# Patient Record
Sex: Female | Born: 1990 | Race: White | Hispanic: No | Marital: Married | Smoking: Current every day smoker
Health system: Southern US, Community
[De-identification: ages and names within clinical notes are randomized; demographics above are authoritative.]

## PROBLEM LIST (undated history)

## (undated) HISTORY — PX: TONSILLECTOMY: SUR1361

---
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
## 2012-06-30 ENCOUNTER — Encounter (HOSPITAL_BASED_OUTPATIENT_CLINIC_OR_DEPARTMENT_OTHER): Payer: Self-pay | Admitting: *Deleted

## 2012-06-30 ENCOUNTER — Emergency Department (HOSPITAL_BASED_OUTPATIENT_CLINIC_OR_DEPARTMENT_OTHER)
Admission: EM | Admit: 2012-06-30 | Discharge: 2012-06-30 | Disposition: A | Discharge status: Home / Self Care | Payer: Self-pay | Attending: Emergency Medicine | Admitting: Emergency Medicine

## 2012-06-30 DIAGNOSIS — K5289 Other specified noninfective gastroenteritis and colitis: Secondary | ICD-10-CM | POA: Insufficient documentation

## 2012-06-30 DIAGNOSIS — N760 Acute vaginitis: Secondary | ICD-10-CM | POA: Insufficient documentation

## 2012-06-30 DIAGNOSIS — B9689 Other specified bacterial agents as the cause of diseases classified elsewhere: Secondary | ICD-10-CM

## 2012-06-30 DIAGNOSIS — K529 Noninfective gastroenteritis and colitis, unspecified: Secondary | ICD-10-CM

## 2012-06-30 DIAGNOSIS — R197 Diarrhea, unspecified: Secondary | ICD-10-CM | POA: Insufficient documentation

## 2012-06-30 DIAGNOSIS — Z87891 Personal history of nicotine dependence: Secondary | ICD-10-CM | POA: Insufficient documentation

## 2012-06-30 DIAGNOSIS — R112 Nausea with vomiting, unspecified: Secondary | ICD-10-CM | POA: Insufficient documentation

## 2012-06-30 LAB — URINALYSIS, ROUTINE W REFLEX MICROSCOPIC
Nitrite: NEGATIVE
Specific Gravity, Urine: 1.031 — ABNORMAL HIGH (ref 1.005–1.030)
Urobilinogen, UA: 0.2 mg/dL (ref 0.0–1.0)
pH: 5 (ref 5.0–8.0)

## 2012-06-30 LAB — CBC WITH DIFFERENTIAL/PLATELET
HCT: 37.6 % (ref 36.0–46.0)
Hemoglobin: 12.4 g/dL (ref 12.0–15.0)
Lymphs Abs: 0.7 10*3/uL (ref 0.7–4.0)
MCH: 30.2 pg (ref 26.0–34.0)
MCHC: 33 g/dL (ref 30.0–36.0)
Monocytes Absolute: 0.4 10*3/uL (ref 0.1–1.0)
Monocytes Relative: 9 % (ref 3–12)
Neutro Abs: 3.4 10*3/uL (ref 1.7–7.7)
Neutrophils Relative %: 74 % (ref 43–77)
RBC: 4.1 MIL/uL (ref 3.87–5.11)

## 2012-06-30 LAB — COMPREHENSIVE METABOLIC PANEL
Alkaline Phosphatase: 56 U/L (ref 39–117)
BUN: 14 mg/dL (ref 6–23)
Chloride: 107 mEq/L (ref 96–112)
Creatinine, Ser: 0.7 mg/dL (ref 0.50–1.10)
GFR calc Af Amer: >90 mL/min (ref >90)
GFR calc non Af Amer: >90 mL/min (ref >90)
Glucose, Bld: 81 mg/dL (ref 70–99)
Potassium: 3.8 mEq/L (ref 3.5–5.1)
Total Bilirubin: 0.5 mg/dL (ref 0.3–1.2)

## 2012-06-30 LAB — WET PREP, GENITAL: Trich, Wet Prep: NONE SEEN

## 2012-06-30 LAB — URINE MICROSCOPIC-ADD ON

## 2012-06-30 MED ORDER — OXYCODONE-ACETAMINOPHEN 5-325 MG PO TABS
1.0000 | ORAL_TABLET | Freq: Four times a day (QID) | ORAL | Status: DC | PRN
Start: 2012-06-30 — End: 2019-07-07

## 2012-06-30 MED ORDER — METRONIDAZOLE 500 MG PO TABS: 500.0000 mg | ORAL_TABLET | Freq: Two times a day (BID) | ORAL | Status: DC

## 2012-06-30 MED ORDER — PROMETHAZINE HCL 25 MG PO TABS: 25.0000 mg | ORAL_TABLET | Freq: Four times a day (QID) | ORAL | Status: DC | PRN

## 2012-06-30 MED ORDER — SODIUM CHLORIDE 0.9 % IV BOLUS (SEPSIS)
1000.0000 mL | Freq: Once | INTRAVENOUS | Status: AC
  Administered 2012-06-30: 1000 mL via INTRAVENOUS

## 2012-06-30 NOTE — ED Provider Notes (Signed)
History   This chart was scribed for [name], MD by Donne Anon, ED Scribe. This patient was seen in room MH11/MH11 and the patient's care was started at 17:41.   CSN: [id number]  Arrival date & time 06/30/12  2440   First MD Initiated Contact with Patient 06/30/12 1818      Chief Complaint  Patient presents with  . Abdominal Pain     The history is provided by the patient. No language interpreter was used.   Ruth Fletcher is a 21 y.o. female who presents to the Emergency Department complaining of gradual onset, intermittent (1 hour episodes), lower abdominal pain which began 2 days ago. She reports associated nausea, diarrhea, and 1 episode of emesis this morning. She denies hematachezia, dysuria, fever, or vaginal discharge. She has never been pregnant before and does not receive regular PAP smears. Her last menstrual period began today and this is not typical menstrual pain. She denies any previous similar episodes and does not have a h/o any illness.    History reviewed. No pertinent past medical history.  Past Surgical History  Procedure Date  . Tonsillectomy     History reviewed. No pertinent family history.  History  Substance Use Topics  . Smoking status: Former Games developer  . Smokeless tobacco: Not on file  . Alcohol Use: No    OB History    Grav Para Term Preterm Abortions TAB SAB Ect Mult Living                  Review of Systems A complete 10 system review of systems was obtained and all systems are negative except as noted in the HPI and PMH.   Allergies  Review of patient's allergies indicates no known allergies.  Home Medications   Current Outpatient Rx  Name  Route  Sig  Dispense  Refill  . AMOXICILLIN PO   Oral   Take by mouth.           . IBUPROFEN 100 MG/5ML PO SUSP   Oral   Take 200 mg by mouth once.           . OXYCODONE HCL PO   Oral   Take 5-10 mLs by mouth every 4 (four) hours as needed. For pain            Triage  Vitals: BP 120/68  Pulse 99  Temp 99 F (37.2 C) (Oral)  Resp 16  Ht 5\' 10"  (1.778 m)  Wt 155 lb (70.308 kg)  BMI 22.24 kg/m2  SpO2 100%  LMP 06/30/2012  Physical Exam  Nursing note and vitals reviewed. Constitutional: She is oriented to person, place, and time. She appears well-developed and well-nourished.  HENT:  Head: Normocephalic and atraumatic.  Eyes: EOM are normal. Pupils are equal, round, and reactive to light.  Neck: Normal range of motion. Neck supple.  Cardiovascular: Normal rate, normal heart sounds and intact distal pulses.   Pulmonary/Chest: Effort normal and breath sounds normal.  Abdominal: Bowel sounds are normal. She exhibits no distension. There is tenderness. There is no guarding.       Tender across lower abdomen bilaterally.  Musculoskeletal: Normal range of motion. She exhibits no edema and no tenderness.  Neurological: She is alert and oriented to person, place, and time. She has normal strength. No cranial nerve deficit or sensory deficit.  Skin: Skin is warm and dry. No rash noted.  Psychiatric: She has a normal mood and affect.  ED Course  Procedures (including critical care time) DIAGNOSTIC STUDIES: Oxygen Saturation is 100% on room air, normal by my interpretation.    COORDINATION OF CARE: 6:26 PMDiscussed treatment plan which includes a pelvic exam with pt at bedside and pt agreed to plan.    Labs Reviewed  URINALYSIS, ROUTINE W REFLEX MICROSCOPIC - Abnormal; Notable for the following:    APPearance CLOUDY (*)     Specific Gravity, Urine 1.031 (*)     Hgb urine dipstick MODERATE (*)     Bilirubin Urine SMALL (*)     Ketones, ur 15 (*)     Leukocytes, UA SMALL (*)     All other components within normal limits  URINE MICROSCOPIC-ADD ON - Abnormal; Notable for the following:    Crystals CA OXALATE CRYSTALS (*)     All other components within normal limits  PREGNANCY, URINE   No results found.   No diagnosis found.    MDM  Pt  asked for female to do pelvic exam, done by Langston Masker, PA who reports no tenderness, discharge, etc.  PT still having intermittent cramping pain, no focal RLQ tenderness, normal WBC, suspect viral illness although early appendicitis is also in the differential. Pt advised to return to the ER for worsening pain, persistent vomiting or for any other concerns. Will treat for BV based on wet prep. No symptoms concerning for yeast infection.   I personally performed the services described in this documentation, which was scribed in my presence. The recorded information has been reviewed and is accurate.         Emmerson Shuffield B. Bernette Mayers, MD 06/30/12 2020

## 2012-06-30 NOTE — ED Notes (Signed)
Pt has agreed to pelvic exam with female EDPA

## 2012-06-30 NOTE — ED Notes (Signed)
Warm blankets given.

## 2012-06-30 NOTE — ED Notes (Signed)
Patient is resting comfortably. 

## 2012-06-30 NOTE — ED Provider Notes (Signed)
Medical screening examination/treatment/procedure(s) were conducted as a shared visit with non-physician practitioner(s) and myself.  I personally evaluated the patient during the encounter   Charles B. Bernette Mayers, MD 06/30/12 2024

## 2012-06-30 NOTE — ED Provider Notes (Signed)
History     CSN: [id number]  Arrival date & time 06/30/12  1741   First MD Initiated Contact with Patient 06/30/12 1818      Chief Complaint  Patient presents with  . Abdominal Pain    (Consider location/radiation/quality/duration/timing/severity/associated sxs/prior treatment) HPI  History reviewed. No pertinent past medical history.  Past Surgical History  Procedure Date  . Tonsillectomy     History reviewed. No pertinent family history.  History  Substance Use Topics  . Smoking status: Former Games developer  . Smokeless tobacco: Not on file  . Alcohol Use: No    OB History    Grav Para Term Preterm Abortions TAB SAB Ect Mult Living                  Review of Systems  Allergies  Review of patient's allergies indicates no known allergies.  Home Medications   Current Outpatient Rx  Name  Route  Sig  Dispense  Refill  . METRONIDAZOLE 500 MG PO TABS   Oral   Take 1 tablet (500 mg total) by mouth 2 (two) times daily.   14 tablet   0   . OXYCODONE-ACETAMINOPHEN 5-325 MG PO TABS   Oral   Take 1-2 tablets by mouth every 6 (six) hours as needed for pain.   20 tablet   0   . PROMETHAZINE HCL 25 MG PO TABS   Oral   Take 1 tablet (25 mg total) by mouth every 6 (six) hours as needed for nausea.   20 tablet   0     BP 120/68  Pulse 99  Temp 99 F (37.2 C) (Oral)  Resp 16  Ht 5\' 10"  (1.778 m)  Wt 155 lb (70.308 kg)  BMI 22.24 kg/m2  SpO2 100%  LMP 06/30/2012  Physical Exam  Genitourinary: Uterus normal.       Dark red blood vaginal vault,  Adnexa nontender, cervix nontender,      ED Course  Procedures (including critical care time)  Labs Reviewed  URINALYSIS, ROUTINE W REFLEX MICROSCOPIC - Abnormal; Notable for the following:    APPearance CLOUDY (*)     Specific Gravity, Urine 1.031 (*)     Hgb urine dipstick MODERATE (*)     Bilirubin Urine SMALL (*)     Ketones, ur 15 (*)     Leukocytes, UA SMALL (*)     All other components within normal  limits  URINE MICROSCOPIC-ADD ON - Abnormal; Notable for the following:    Crystals CA OXALATE CRYSTALS (*)     All other components within normal limits  WET PREP, GENITAL - Abnormal; Notable for the following:    Yeast Wet Prep HPF POC MANY (*)     Clue Cells Wet Prep HPF POC MODERATE (*)     WBC, Wet Prep HPF POC MANY (*)     All other components within normal limits  PREGNANCY, URINE  CBC WITH DIFFERENTIAL  COMPREHENSIVE METABOLIC PANEL  GC/CHLAMYDIA PROBE AMP   No results found.   1. Gastroenteritis       MDM          Elson Areas, [place] 06/30/12 2023

## 2012-06-30 NOTE — ED Notes (Signed)
Pt c/o abd pain/n/v/d x 2 days.  

## 2019-03-10 ENCOUNTER — Emergency Department (INDEPENDENT_AMBULATORY_CARE_PROVIDER_SITE_OTHER): Payer: Self-pay

## 2019-03-10 ENCOUNTER — Emergency Department
Admission: EM | Admit: 2019-03-10 | Discharge: 2019-03-10 | Disposition: A | Discharge status: Home / Self Care | Payer: Self-pay | Source: Home / Self Care | Attending: Family Medicine | Admitting: Family Medicine

## 2019-03-10 ENCOUNTER — Other Ambulatory Visit: Payer: Self-pay

## 2019-03-10 DIAGNOSIS — J0120 Acute ethmoidal sinusitis, unspecified: Secondary | ICD-10-CM

## 2019-03-10 DIAGNOSIS — G43109 Migraine with aura, not intractable, without status migrainosus: Secondary | ICD-10-CM

## 2019-03-10 DIAGNOSIS — J322 Chronic ethmoidal sinusitis: Secondary | ICD-10-CM

## 2019-03-10 MED ORDER — DEXAMETHASONE SODIUM PHOSPHATE 10 MG/ML IJ SOLN
10.0000 mg | Freq: Once | INTRAMUSCULAR | Status: AC
  Administered 2019-03-10: 10 mg via INTRAMUSCULAR

## 2019-03-10 MED ORDER — KETOROLAC TROMETHAMINE 60 MG/2ML IM SOLN
60.0000 mg | Freq: Once | INTRAMUSCULAR | Status: AC
  Administered 2019-03-10: 60 mg via INTRAMUSCULAR

## 2019-03-10 MED ORDER — AMOXICILLIN 875 MG PO TABS: 875.0000 mg | ORAL_TABLET | Freq: Two times a day (BID) | ORAL | 0 refills | Status: DC

## 2019-03-10 NOTE — Discharge Instructions (Addendum)
May take Pseudoephedrine (30mg, one or two every 4 to 6 hours) for sinus congestion.    May use Afrin nasal spray (or generic oxymetazoline) each morning for about 5 days and then discontinue.  Also recommend using saline nasal spray several times daily and saline nasal irrigation (AYR is a common brand).  Use Flonase nasal spray each morning after using Afrin nasal spray and saline nasal irrigation.   

## 2019-03-10 NOTE — ED Provider Notes (Signed)
Ruth Fletcher    CSN: [id number] Arrival date & time: 03/10/19  5188     History   Chief Complaint Chief Complaint  Patient presents with   Eye Pain    HPI Ruth Fletcher is a 28 y.o. female.   Patient was performing her usual exercise routine at 6:30am today when she suddenly experienced "circles" in her vision and a decrease in her peripheral vision.  She thought she might be dehydrated so she increased her fluid intake without improvement.  Her vision changes gradually resolved over a period of about 3.5 hours, but during that time she gradually developed a throbbing headache behind her eyes.  This headache gradually became severe and generalized, which she describes as the worst headache she has ever had.  She reports photophobia, but no nausea/vomiting.  Prior to her present headache she had been well. She admits that she had migraine headaches while in elementary school.  These gradually resolved, but she continues to have mild monthly premenstrual headaches. She has no family history of migraine headaches.  The history is provided by the patient.  Headache Pain location:  Generalized Quality:  Stabbing Radiates to:  Does not radiate Severity currently:  10/10 Severity at highest:  10/10 Onset quality:  Gradual Duration:  1 hour Timing:  Constant Progression:  Unchanged Chronicity:  New Similar to prior headaches: no   Context: activity and bright light   Relieved by:  None tried Worsened by:  Light and activity Ineffective treatments:  None tried Associated symptoms: congestion, photophobia and visual change   Associated symptoms: no abdominal pain, no blurred vision, no cough, no diarrhea, no dizziness, no drainage, no ear pain, no eye pain, no facial pain, no fatigue, no fever, no focal weakness, no hearing loss, no loss of balance, no myalgias, no nausea, no near-syncope, no neck pain, no neck stiffness, no numbness, no paresthesias, no seizures, no sinus  pressure, no sore throat, no swollen glands, no syncope, no tingling, no URI, no vomiting and no weakness     History reviewed.  Past history of migraine headaches as a child  Active problems:  None   Past Surgical History:  Procedure Laterality Date   TONSILLECTOMY      OB History   No obstetric history      Home Medications    Prior to Admission medications   Medication Sig Start Date End Date Taking? Authorizing Provider  amoxicillin (AMOXIL) 875 MG tablet Take 1 tablet (875 mg total) by mouth 2 (two) times daily. 03/10/19   Kandra Nicolas, MD  metroNIDAZOLE (FLAGYL) 500 MG tablet Take 1 tablet (500 mg total) by mouth 2 (two) times daily. 06/30/12   Calvert Cantor, MD  oxyCODONE-acetaminophen (PERCOCET/ROXICET) 5-325 MG per tablet Take 1-2 tablets by mouth every 6 (six) hours as needed for pain. 06/30/12   Calvert Cantor, MD  promethazine (PHENERGAN) 25 MG tablet Take 1 tablet (25 mg total) by mouth every 6 (six) hours as needed for nausea. 06/30/12   Calvert Cantor, MD    Family History History reviewed. No family history of migraine headaches.  Social History Social History   Tobacco Use   Smoking status: Current Every Day Smoker    Types: E-cigarettes   Smokeless tobacco: Never Used  Substance Use Topics   Alcohol use: Yes    Comment: 2x a week   Drug use: No     Allergies   Patient has no known allergies.   Review of Systems Review  of Systems  Constitutional: Negative for activity change, appetite change, chills, diaphoresis, fatigue and fever.  HENT: Positive for congestion. Negative for ear pain, hearing loss, postnasal drip, sinus pressure and sore throat.   Eyes: Positive for photophobia. Negative for blurred vision and pain.  Respiratory: Negative for cough.   Cardiovascular: Negative for syncope and near-syncope.  Gastrointestinal: Negative for abdominal pain, diarrhea, nausea and vomiting.  Musculoskeletal: Negative for myalgias, neck  pain and neck stiffness.  Neurological: Positive for headaches. Negative for dizziness, focal weakness, seizures, weakness, numbness, paresthesias and loss of balance.  All other systems reviewed and are negative.    Physical Exam Triage Vital Signs ED Triage Vitals  Enc Vitals Group     BP 03/10/19 1042 117/76     Pulse Rate 03/10/19 1042 63     Resp 03/10/19 1042 20     Temp 03/10/19 1042 98.4 F (36.9 C)     Temp Source 03/10/19 1042 Oral     SpO2 03/10/19 1042 100 %     Weight 03/10/19 1043 164 lb (74.4 kg)     Height 03/10/19 1043 5\' 9"  (1.753 m)     Head Circumference --      Peak Flow --      Pain Score 03/10/19 1043 9     Pain Loc --      Pain Edu? --      Excl. in GC? --    No data found.  Updated Vital Signs BP 117/76 (BP Location: Right Arm)    Pulse 63    Temp 98.4 F (36.9 C) (Oral)    Resp 20    Ht 5\' 9"  (1.753 m)    Wt 74.4 kg    LMP 03/02/2019    SpO2 100%    BMI 24.22 kg/m   Visual Acuity Right Eye Distance: 20/25 Left Eye Distance: 20/25 Bilateral Distance: 20/25  Right Eye Near:   Left Eye Near:    Bilateral Near:     Physical Exam Vitals signs and nursing note reviewed.  Constitutional:      General: She is in acute distress.     Appearance: Normal appearance. She is not ill-appearing.  HENT:     Head: Normocephalic.     Comments: No sinus tenderness present.    Right Ear: Tympanic membrane normal.     Left Ear: Tympanic membrane normal.     Nose: Congestion present.     Mouth/Throat:     Pharynx: Oropharynx is clear.  Eyes:     Extraocular Movements: Extraocular movements intact.     Conjunctiva/sclera: Conjunctivae normal.     Pupils: Pupils are equal, round, and reactive to light.     Comments: Photophobia is present.  Fundi benign.  Neck:     Musculoskeletal: Neck supple. No muscular tenderness.  Cardiovascular:     Heart sounds: Normal heart sounds.  Pulmonary:     Breath sounds: Normal breath sounds.  Abdominal:      Tenderness: There is no abdominal tenderness.  Musculoskeletal:     Right lower leg: No edema.     Left lower leg: No edema.  Lymphadenopathy:     Cervical: No cervical adenopathy.  Skin:    General: Skin is warm and dry.  Neurological:     General: No focal deficit present.     Mental Status: She is alert and oriented to person, place, and time.     Cranial Nerves: No cranial nerve deficit.  Sensory: No sensory deficit.     Motor: No weakness.     Coordination: Coordination normal.     Gait: Gait normal.     Deep Tendon Reflexes: Reflexes normal.      UC Treatments / Results  Labs (all labs ordered are listed, but only abnormal results are displayed) Labs Reviewed - No data to display  EKG   Radiology Ct Head Wo Contrast  Result Date: 03/10/2019 CLINICAL DATA:  Acute onset severe headache.  Visual disturbance EXAM: CT HEAD WITHOUT CONTRAST TECHNIQUE: Contiguous axial images were obtained from the base of the skull through the vertex without intravenous contrast. COMPARISON:  None. FINDINGS: Brain: The ventricles are normal in size and configuration. There is no intracranial mass, hemorrhage, extra-axial fluid collection, or midline shift. Brain parenchyma appears unremarkable. No evident acute infarct. Vascular: No hyperdense vessel. There is no appreciable vascular calcification. Skull: Bony calvarium appears intact. Sinuses/Orbits: There is mucosal thickening and opacification in multiple ethmoid air cells. Other visualized paranasal sinuses are clear. Frontal sinuses are virtually aplastic. Visualized orbits appear symmetric bilaterally. Other: Mastoid air cells are clear. IMPRESSION: Ethmoid sinus disease.  Study otherwise unremarkable. Electronically Signed   By: Bretta BangWilliam  Woodruff III M.D.   On: 03/10/2019 11:50    Procedures Procedures (including critical Fletcher time)  Medications Ordered in UC Medications  ketorolac (TORADOL) injection 60 mg (has no administration in  time range)  dexamethasone (DECADRON) injection 10 mg (has no administration in time range)    Initial Impression / Assessment and Plan / UC Course  I have reviewed the triage vital signs and the nursing notes.  Pertinent labs & imaging results that were available during my Fletcher of the patient were reviewed by me and considered in my medical decision making (see chart for details).    Ethmoid sinusitis probably an incidental finding. Administered Toradol 60mg  IM.  Administered Decadron 10mg  IM.  Begin amoxicillin  Followup with Family Doctor if headaches begin to recur.   Final Clinical Impressions(s) / UC Diagnoses   Final diagnoses:  Migraine with aura and without status migrainosus, not intractable  Ethmoid sinusitis, unspecified chronicity     Discharge Instructions      May take Pseudoephedrine (30mg , one or two every 4 to 6 hours) for sinus congestion.   May use Afrin nasal spray (or generic oxymetazoline) each morning for about 5 days and then discontinue.  Also recommend using saline nasal spray several times daily and saline nasal irrigation (AYR is a common brand).  Use Flonase nasal spray each morning after using Afrin nasal spray and saline nasal irrigation.          ED Prescriptions    Medication Sig Dispense Auth. Provider   amoxicillin (AMOXIL) 875 MG tablet Take 1 tablet (875 mg total) by mouth 2 (two) times daily. 20 tablet Ruth Fletcher, Aadhya Bustamante A, MD       Ruth Fletcher, Yeraldi Fidler A, MD 03/11/19 [phone]

## 2019-03-10 NOTE — ED Triage Notes (Signed)
Was working out this morning and had "circles" in her vision.  Thought she may have been dehydrated, so hydrated.  Most of the visual disturbances have resolved, but having a headache behind eyes and pain in the back of the head/neck area.

## 2019-07-07 ENCOUNTER — Encounter: Payer: Self-pay | Admitting: Emergency Medicine

## 2019-07-07 ENCOUNTER — Other Ambulatory Visit: Payer: Self-pay

## 2019-07-07 ENCOUNTER — Emergency Department
Admission: EM | Admit: 2019-07-07 | Discharge: 2019-07-07 | Disposition: A | Discharge status: Home / Self Care | Payer: HRSA Program | Source: Home / Self Care

## 2019-07-07 DIAGNOSIS — B9789 Other viral agents as the cause of diseases classified elsewhere: Secondary | ICD-10-CM

## 2019-07-07 DIAGNOSIS — Z20828 Contact with and (suspected) exposure to other viral communicable diseases: Secondary | ICD-10-CM | POA: Diagnosis not present

## 2019-07-07 DIAGNOSIS — Z20822 Contact with and (suspected) exposure to covid-19: Secondary | ICD-10-CM

## 2019-07-07 NOTE — ED Triage Notes (Signed)
Fever, cough, headache, sore throat x 2 days

## 2019-07-07 NOTE — ED Provider Notes (Signed)
[facility]    CSN: [id number] Arrival date & time: 07/07/19  1201      History   Chief Complaint Chief Complaint  Patient presents with  . Fever    HPI Ruth Fletcher is a 28 y.o. female.   The history is provided by the patient. No language interpreter was used.  Fever Temp source:  Oral Severity:  Moderate Onset quality:  Gradual Duration:  1 day Timing:  Constant Progression:  Worsening Chronicity:  New Worsened by:  Nothing Ineffective treatments:  None tried Risk factors: sick contacts   Pt concerned about covid.  Pt has been self quarantining.  Pt reports she had a fever yesterday.  Pt has been sick for the past 5 days.   History reviewed. No pertinent past medical history.  There are no active problems to display for this patient.   Past Surgical History:  Procedure Laterality Date  . TONSILLECTOMY      OB History   No obstetric history on file.      Home Medications    Prior to Admission medications   Medication Sig Start Date End Date Taking? Authorizing Provider  spironolactone (ALDACTONE) 100 MG tablet Take 100 mg by mouth daily.   Yes [provider]    Family History History reviewed. No pertinent family history.  Social History Social History   Tobacco Use  . Smoking status: Current Every Day Smoker    Types: E-cigarettes  . Smokeless tobacco: Never Used  Substance Use Topics  . Alcohol use: Yes    Comment: 2x a week  . Drug use: No     Allergies   Patient has no known allergies.   Review of Systems Review of Systems  Constitutional: Positive for fever.  All other systems reviewed and are negative.    Physical Exam Triage Vital Signs ED Triage Vitals  Enc Vitals Group     BP 07/07/19 1414 110/71     Pulse Rate 07/07/19 1414 74     Resp --      Temp 07/07/19 1414 99.4 F (37.4 C)     Temp Source 07/07/19 1414 Oral     SpO2 07/07/19 1414 99 %     Weight 07/07/19 1415 170 lb (77.1 kg)   Height 07/07/19 1415 5\' 10"  (1.778 m)     Head Circumference --      Peak Flow --      Pain Score 07/07/19 1415 0     Pain Loc --      Pain Edu? --      Excl. in GC? --    No data found.  Updated Vital Signs BP 110/71 (BP Location: Right Arm)   Pulse 74   Temp 99.4 F (37.4 C) (Oral)   Ht 5\' 10"  (1.778 m)   Wt 77.1 kg   SpO2 99%   BMI 24.39 kg/m   Visual Acuity Right Eye Distance:   Left Eye Distance:   Bilateral Distance:    Right Eye Near:   Left Eye Near:    Bilateral Near:     Physical Exam Vitals signs and nursing note reviewed.  Constitutional:      Appearance: She is well-developed.  HENT:     Head: Normocephalic.  Neck:     Musculoskeletal: Normal range of motion.  Pulmonary:     Effort: Pulmonary effort is normal.  Abdominal:     General: There is no distension.  Musculoskeletal: Normal range of motion.  Neurological:     Mental Status: She is alert and oriented to person, place, and time.      UC Treatments / Results  Labs (all labs ordered are listed, but only abnormal results are displayed) Labs Reviewed  NOVEL CORONAVIRUS, NAA    EKG   Radiology No results found.  Procedures Procedures (including critical care time)  Medications Ordered in UC Medications - No data to display  Initial Impression / Assessment and Plan / UC Course  I have reviewed the triage vital signs and the nursing notes.  Pertinent labs & imaging results that were available during my care of the patient were reviewed by me and considered in my medical decision making (see chart for details).     MDM  Covid ordered.  Pt advised to quarantine  Final Clinical Impressions(s) / UC Diagnoses   Final diagnoses:  Exposure to COVID-19 virus  Viral respiratory illness     Discharge Instructions     Your Covid test is pending   ED Prescriptions    None    An After Visit Summary was printed and given to the patient.  PDMP not reviewed this encounter.    Fransico Meadow, [place] 07/07/19 4097

## 2019-07-07 NOTE — Discharge Instructions (Signed)
Your Covid test is pending  °

## 2019-07-09 LAB — NOVEL CORONAVIRUS, NAA: SARS-CoV-2, NAA: DETECTED — AB

## 2019-07-10 ENCOUNTER — Telehealth (HOSPITAL_COMMUNITY): Payer: Self-pay | Admitting: Emergency Medicine

## 2019-07-10 NOTE — Telephone Encounter (Signed)

## 2019-07-14 ENCOUNTER — Emergency Department: Admission: EM | Admit: 2019-07-14 | Discharge: 2019-07-14 | Discharge status: Absent without leave | Payer: Self-pay

## 2019-11-10 ENCOUNTER — Other Ambulatory Visit: Payer: Self-pay

## 2019-11-10 ENCOUNTER — Encounter: Payer: Self-pay | Admitting: Emergency Medicine

## 2019-11-10 ENCOUNTER — Emergency Department (INDEPENDENT_AMBULATORY_CARE_PROVIDER_SITE_OTHER)
Admission: EM | Admit: 2019-11-10 | Discharge: 2019-11-10 | Disposition: A | Discharge status: Home / Self Care | Payer: Self-pay | Source: Home / Self Care

## 2019-11-10 DIAGNOSIS — J029 Acute pharyngitis, unspecified: Secondary | ICD-10-CM

## 2019-11-10 LAB — POCT RAPID STREP A (OFFICE): Rapid Strep A Screen: NEGATIVE

## 2019-11-10 NOTE — ED Provider Notes (Signed)
[facility]    CSN: [id number] Arrival date & time: 11/10/19  6599      History   Chief Complaint Chief Complaint  Patient presents with  . Sore Throat    HPI Ruth Fletcher is a 29 y.o. female.   HPI Ruth Fletcher is a 29 y.o. female presenting to UC with c/o 3 days of sore throat, fatigue, generalized HA and fever Tmax 103*F last night.  Denies cough or congestion but has been taking mucinex. Fever broke this morning. No sick contacts or recent travel. She has been able to keep fluids down but doesn't want to eat due to the throat pain.    History reviewed. No pertinent past medical history.  There are no problems to display for this patient.   Past Surgical History:  Procedure Laterality Date  . TONSILLECTOMY      OB History   No obstetric history on file.      Home Medications    Prior to Admission medications   Medication Sig Start Date End Date Taking? Authorizing Provider  spironolactone (ALDACTONE) 100 MG tablet Take 100 mg by mouth daily.    [provider]    Family History Family History  Problem Relation Age of Onset  . Diabetes Mother     Social History Social History   Tobacco Use  . Smoking status: Current Every Day Smoker    Types: E-cigarettes  . Smokeless tobacco: Never Used  Substance Use Topics  . Alcohol use: Yes    Comment: 2x a week  . Drug use: No     Allergies   Patient has no known allergies.   Review of Systems Review of Systems  Constitutional: Positive for chills, fatigue and fever.  HENT: Positive for sore throat. Negative for congestion, ear pain, trouble swallowing and voice change.   Respiratory: Negative for cough and shortness of breath.   Cardiovascular: Negative for chest pain and palpitations.  Gastrointestinal: Negative for abdominal pain, diarrhea, nausea and vomiting.  Musculoskeletal: Negative for arthralgias, back pain and myalgias.  Skin: Negative for rash.  Neurological:  Positive for headaches. Negative for dizziness and light-headedness.  All other systems reviewed and are negative.    Physical Exam Triage Vital Signs ED Triage Vitals  Enc Vitals Group     BP 11/10/19 0935 125/74     Pulse Rate 11/10/19 0935 83     Resp 11/10/19 0935 18     Temp 11/10/19 0935 98.6 F (37 C)     Temp Source 11/10/19 0935 Oral     SpO2 11/10/19 0935 98 %     Weight 11/10/19 0938 165 lb (74.8 kg)     Height 11/10/19 0938 5\' 10"  (1.778 m)     Head Circumference --      Peak Flow --      Pain Score 11/10/19 0938 7     Pain Loc --      Pain Edu? --      Excl. in GC? --    No data found.  Updated Vital Signs BP 125/74 (BP Location: Right Arm)   Pulse 83   Temp 98.6 F (37 C) (Oral)   Resp 18   Ht 5\' 10"  (1.778 m)   Wt 165 lb (74.8 kg)   SpO2 98%   BMI 23.68 kg/m   Visual Acuity Right Eye Distance:   Left Eye Distance:   Bilateral Distance:    Right Eye Near:   Left Eye  Near:    Bilateral Near:     Physical Exam Vitals and nursing note reviewed.  Constitutional:      General: She is not in acute distress.    Appearance: She is well-developed. She is not ill-appearing, toxic-appearing or diaphoretic.  HENT:     Head: Normocephalic and atraumatic.     Right Ear: Tympanic membrane and ear canal normal.     Left Ear: Tympanic membrane and ear canal normal.     Nose: Nose normal.     Mouth/Throat:     Lips: Pink.     Mouth: Mucous membranes are moist.     Pharynx: Oropharynx is clear. Uvula midline. Posterior oropharyngeal erythema present.  Cardiovascular:     Rate and Rhythm: Normal rate and regular rhythm.  Pulmonary:     Effort: Pulmonary effort is normal. No respiratory distress.     Breath sounds: Normal breath sounds. No stridor. No wheezing, rhonchi or rales.  Musculoskeletal:        General: Normal range of motion.     Cervical back: Normal range of motion and neck supple.  Lymphadenopathy:     Cervical: Cervical adenopathy present.   Skin:    General: Skin is warm and dry.  Neurological:     Mental Status: She is alert and oriented to person, place, and time.  Psychiatric:        Behavior: Behavior normal.      UC Treatments / Results  Labs (all labs ordered are listed, but only abnormal results are displayed) Labs Reviewed  STREP A DNA PROBE  POCT RAPID STREP A (OFFICE)    EKG   Radiology No results found.  Procedures Procedures (including critical care time)  Medications Ordered in UC Medications - No data to display  Initial Impression / Assessment and Plan / UC Course  I have reviewed the triage vital signs and the nursing notes.  Pertinent labs & imaging results that were available during my care of the patient were reviewed by me and considered in my medical decision making (see chart for details).     Rapid strep: NEGATIVE Culture sent  Discussed mono testing, pt declined as it would not change tx plan at this time  Encouraged fluids, rest, tylenol and motrin AVS provided  Final Clinical Impressions(s) / UC Diagnoses   Final diagnoses:  Acute pharyngitis, unspecified etiology     Discharge Instructions      You may take 500mg  acetaminophen every 4-6 hours or in combination with ibuprofen 400-600mg  every 6-8 hours as needed for pain, inflammation, and fever.  Be sure to well hydrated with clear liquids and get at least 8 hours of sleep at night, preferably more while sick.   Please follow up with family medicine in 1 week if needed.     ED Prescriptions    None     PDMP not reviewed this encounter.   Noe Gens, PA-C 11/10/19 1009

## 2019-11-10 NOTE — Discharge Instructions (Signed)
  You may take 500mg acetaminophen every 4-6 hours or in combination with ibuprofen 400-600mg every 6-8 hours as needed for pain, inflammation, and fever.  Be sure to well hydrated with clear liquids and get at least 8 hours of sleep at night, preferably more while sick.   Please follow up with family medicine in 1 week if needed.   

## 2019-11-10 NOTE — ED Triage Notes (Signed)
Sore throat, fatigue, headache x 3 days, fever 103 last night, took Mucinex

## 2019-11-11 ENCOUNTER — Telehealth (HOSPITAL_COMMUNITY): Payer: Self-pay | Admitting: *Deleted

## 2019-11-11 LAB — STREP A DNA PROBE: Group A Strep Probe: DETECTED — AB

## 2019-11-11 MED ORDER — PENICILLIN V POTASSIUM 500 MG PO TABS: 500.0000 mg | ORAL_TABLET | Freq: Two times a day (BID) | ORAL | 0 refills | Status: AC

## 2019-11-11 NOTE — Telephone Encounter (Signed)
Strep culture positive, will rx penicillin V 500mg  PO BID x 10days to pharmacy of record

## 2020-12-04 IMAGING — CT CT HEAD WITHOUT CONTRAST
3 series · 15 of 47 positions shown, 18 images · non-contrast
Comparison: None.

CLINICAL DATA: Acute onset severe headache.  Visual disturbance

EXAM:
CT HEAD WITHOUT CONTRAST
TECHNIQUE: Contiguous axial images were obtained from the base of the skull
through the vertex without intravenous contrast.

[Series 2: head wo · axial · 0.42mm/px · z∈[-152,-27]mm · 9 of 31 slices shown, 12 images]
[im 3/31  brain]
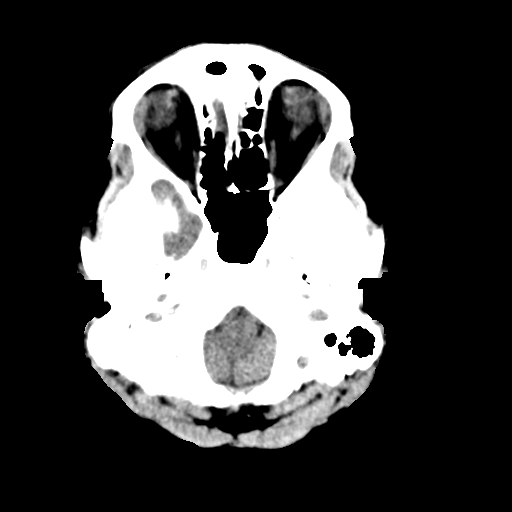
[im 3/31  bone]
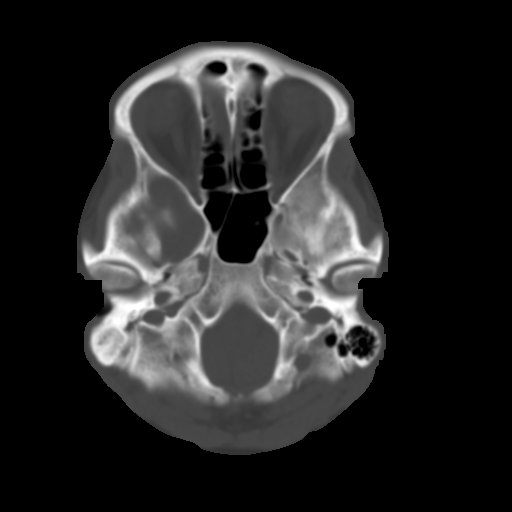
[im 6/31  brain]
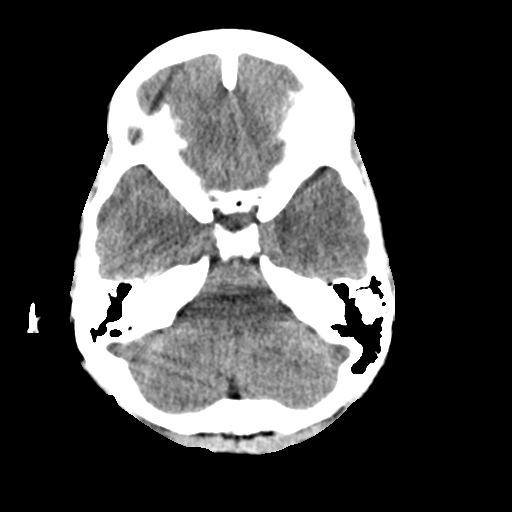
[im 9/31  brain]
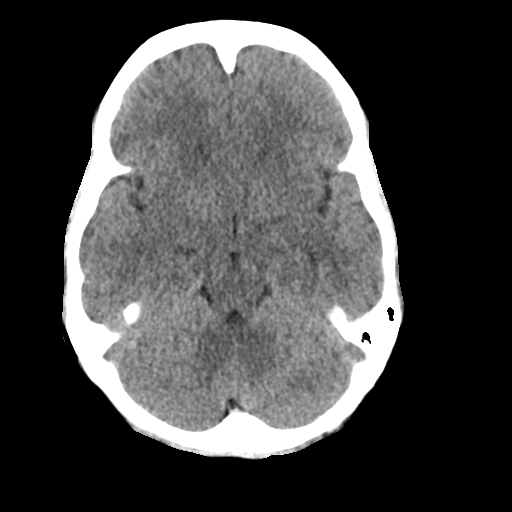
[im 12/31  brain]
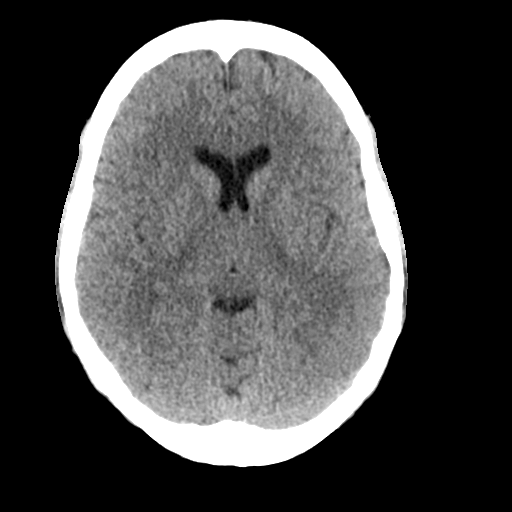
[im 16/31  brain]
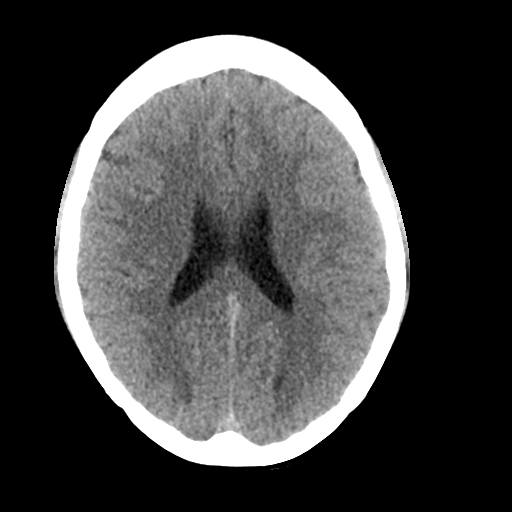
[im 16/31  bone]
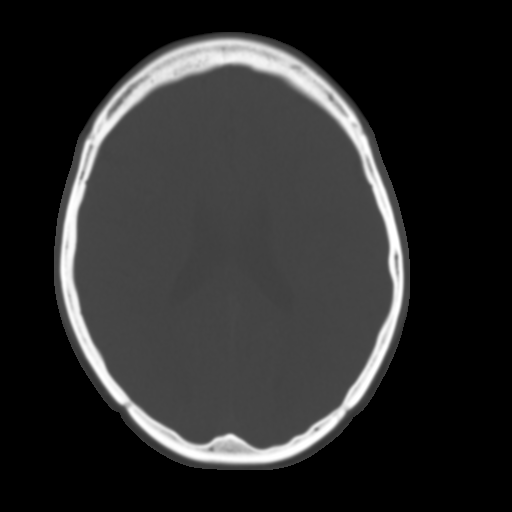
[im 19/31  brain]
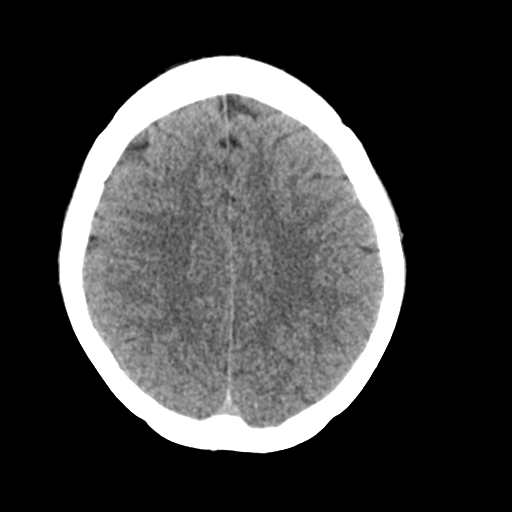
[im 22/31  brain]
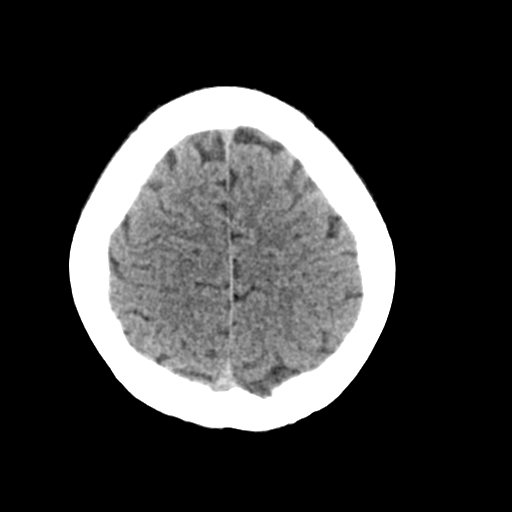
[im 25/31  brain]
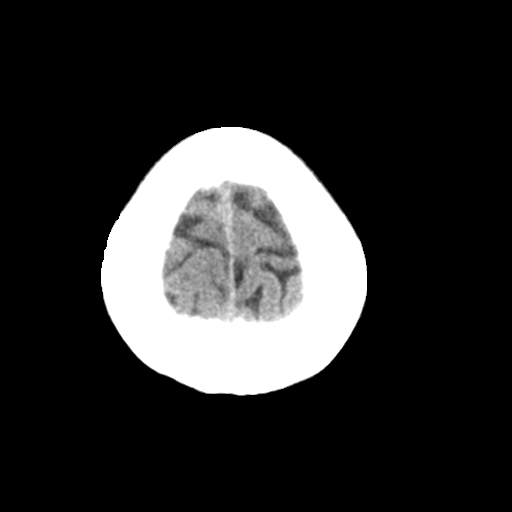
[im 28/31  brain]
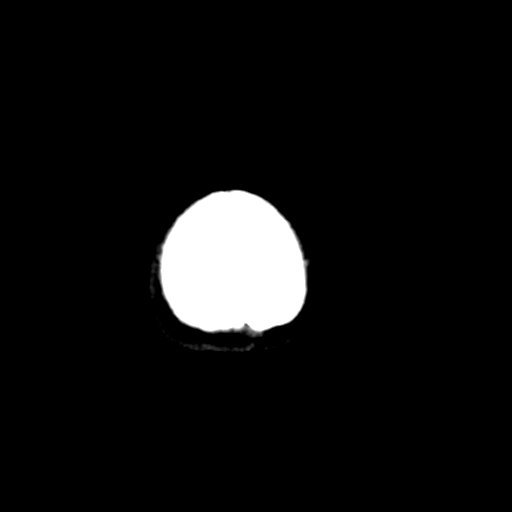
[im 28/31  bone]
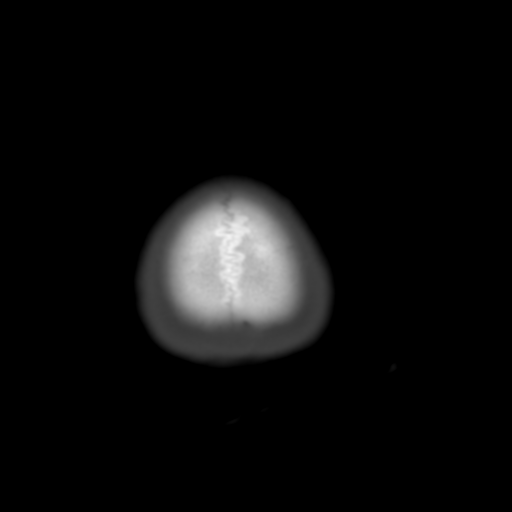

[Series 4: head wo coronal · coronal · 0.31mm/px · 3 of 66 slices shown]
[im 22/66  brain]
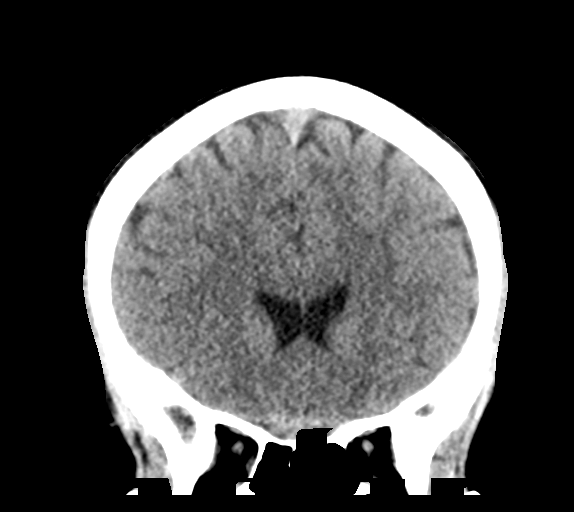
[im 29/66  brain]
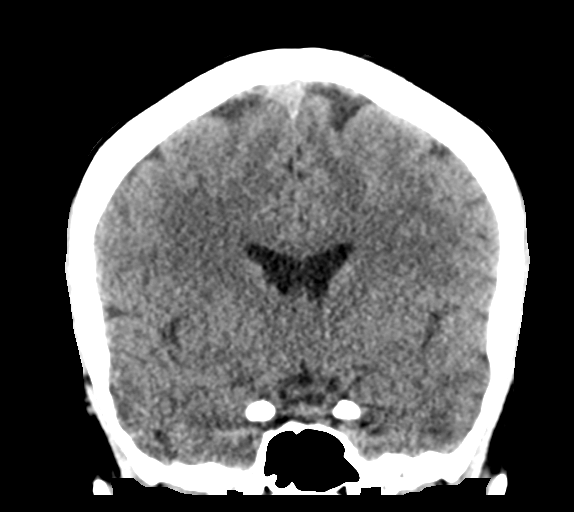
[im 37/66  brain]
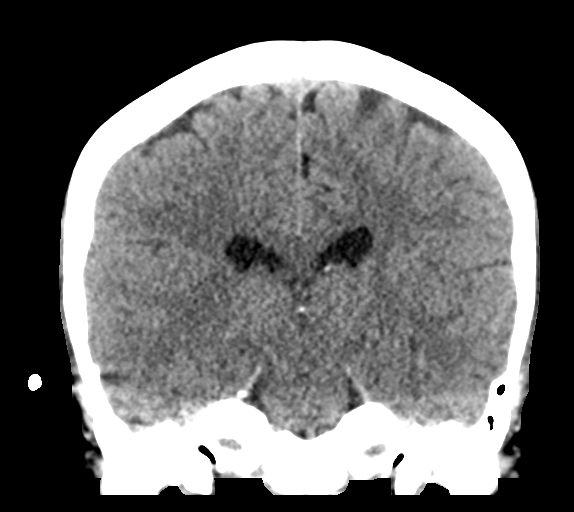

[Series 5: head wo sagittal · sagittal · 0.31mm/px · 3 of 55 slices shown]
[im 19/55  brain]
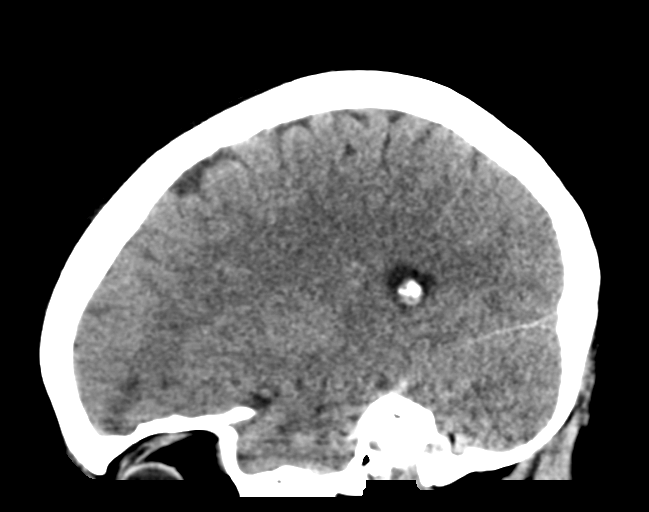
[im 28/55  brain]
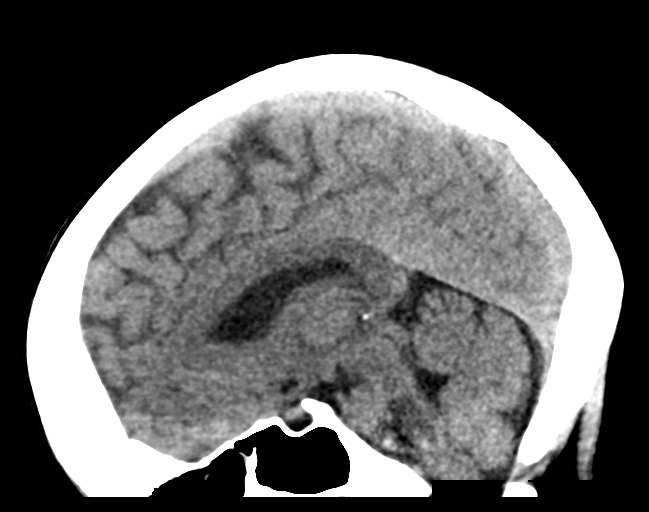
[im 37/55  brain]
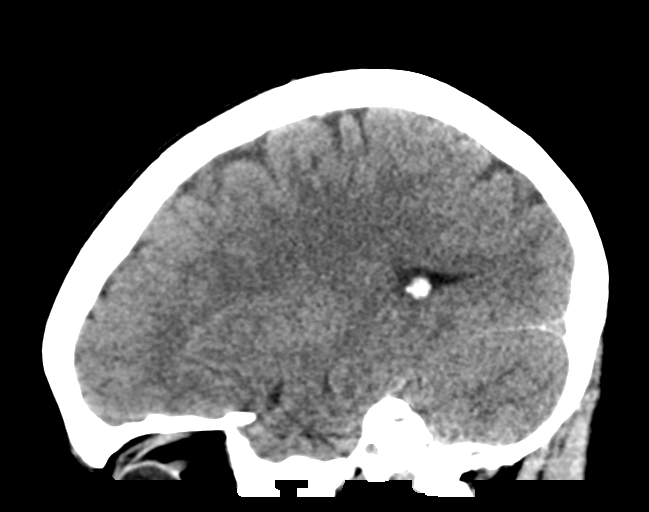

[15 of 47 positions shown; findings below may reference images not displayed]

FINDINGS: Brain: The ventricles are normal in size and configuration. There is
no intracranial mass, hemorrhage, extra-axial fluid collection, or
midline shift. Brain parenchyma appears unremarkable. No evident
acute infarct.

Vascular: No hyperdense vessel. There is no appreciable vascular
calcification.

Skull: Bony calvarium appears intact.

Sinuses/Orbits: There is mucosal thickening and opacification in
multiple ethmoid air cells. Other visualized paranasal sinuses are
clear. Frontal sinuses are virtually aplastic. Visualized orbits
appear symmetric bilaterally.

Other: Mastoid air cells are clear.
IMPRESSION: Ethmoid sinus disease.  Study otherwise unremarkable.

## 2022-05-21 ENCOUNTER — Ambulatory Visit: Payer: Self-pay

## 2022-05-21 ENCOUNTER — Ambulatory Visit
Admission: EM | Admit: 2022-05-21 | Discharge: 2022-05-21 | Disposition: A | Discharge status: Home / Self Care | Payer: Commercial Managed Care - PPO | Attending: Urgent Care | Admitting: Urgent Care

## 2022-05-21 DIAGNOSIS — N760 Acute vaginitis: Secondary | ICD-10-CM | POA: Diagnosis not present

## 2022-05-21 DIAGNOSIS — B9689 Other specified bacterial agents as the cause of diseases classified elsewhere: Secondary | ICD-10-CM | POA: Insufficient documentation

## 2022-05-21 DIAGNOSIS — B3731 Acute candidiasis of vulva and vagina: Secondary | ICD-10-CM | POA: Insufficient documentation

## 2022-05-21 MED ORDER — METRONIDAZOLE 500 MG PO TABS: 500.0000 mg | ORAL_TABLET | Freq: Two times a day (BID) | ORAL | 0 refills | Status: DC

## 2022-05-21 MED ORDER — FLUCONAZOLE 150 MG PO TABS: 150.0000 mg | ORAL_TABLET | ORAL | 0 refills | Status: DC

## 2022-05-21 NOTE — ED Triage Notes (Signed)
Patient presents to [facility] for vaginal discharge and mild irritation since 2 days ago. Hx of yeast infections.

## 2022-05-21 NOTE — ED Provider Notes (Signed)
[facility]  Note:  This document was prepared using Systems analyst and may include unintentional dictation errors.  MRN: [id number] DOB: 23-Jul-1991  Subjective:   Ruth Fletcher is a 31 y.o. female presenting for 2-day history of vaginal discharge and mild irritation.  Feels like it is bacterial vaginosis.  Has a remote history of this.  Sexually active, has 1 female partner, no condom use. Is on OCP. Denies fever, n/v, abdominal pain, pelvic pain, rashes, dysuria, urinary frequency, hematuria.  Patient has no concern for pregnancy.  No current facility-administered medications for this encounter.  Current Outpatient Medications:    LESSINA-28 0.1-20 MG-MCG tablet, Take 1 tablet by mouth daily., Disp: , Rfl:    spironolactone (ALDACTONE) 100 MG tablet, Take 100 mg by mouth daily., Disp: , Rfl:    No Known Allergies  History reviewed. No pertinent past medical history.   Past Surgical History:  Procedure Laterality Date   TONSILLECTOMY      Family History  Problem Relation Age of Onset   Diabetes Mother     Social History   Tobacco Use   Smoking status: Every Day    Types: E-cigarettes   Smokeless tobacco: Never  Vaping Use   Vaping Use: Every day   Substances: Nicotine, Flavoring  Substance Use Topics   Alcohol use: Yes    Comment: 2x a week   Drug use: No    ROS   Objective:   Vitals: BP 117/69 (BP Location: Right Arm)   Pulse 95   Temp 98.1 F (36.7 C) (Oral)   Resp 16   LMP 05/07/2022   SpO2 98%   Physical Exam Constitutional:      General: She is not in acute distress.    Appearance: Normal appearance. She is well-developed. She is not ill-appearing, toxic-appearing or diaphoretic.  HENT:     Head: Normocephalic and atraumatic.     Nose: Nose normal.     Mouth/Throat:     Mouth: Mucous membranes are moist.  Eyes:     General: No scleral icterus.       Right eye: No discharge.        Left eye: No  discharge.     Extraocular Movements: Extraocular movements intact.     Conjunctiva/sclera: Conjunctivae normal.  Cardiovascular:     Rate and Rhythm: Normal rate.  Pulmonary:     Effort: Pulmonary effort is normal.  Abdominal:     General: Bowel sounds are normal. There is no distension.     Palpations: Abdomen is soft. There is no mass.     Tenderness: There is no abdominal tenderness. There is no right CVA tenderness, left CVA tenderness, guarding or rebound.  Skin:    General: Skin is warm and dry.  Neurological:     General: No focal deficit present.     Mental Status: She is alert and oriented to person, place, and time.  Psychiatric:        Mood and Affect: Mood normal.        Behavior: Behavior normal.        Thought Content: Thought content normal.        Judgment: Judgment normal.     Assessment and Plan :   PDMP not reviewed this encounter.  1. Bacterial vaginosis   2. Yeast vaginitis     Patient would like to have empiric treatment for bacterial vaginosis and yeast infection with metronidazole and fluconazole.  Labs  pending, will treat as appropriate otherwise based off of her results.  Counseled patient on potential for adverse effects with medications prescribed/recommended today, ER and return-to-clinic precautions discussed, patient verbalized understanding.    Wallis Bamberg, PA-C 05/21/22 1349

## 2022-05-22 LAB — CERVICOVAGINAL ANCILLARY ONLY
Bacterial Vaginitis (gardnerella): POSITIVE — AB
Candida Glabrata: NEGATIVE
Candida Vaginitis: POSITIVE — AB
Chlamydia: NEGATIVE
Comment: NEGATIVE
Comment: NEGATIVE
Comment: NEGATIVE
Comment: NEGATIVE
Comment: NEGATIVE
Comment: NORMAL
Neisseria Gonorrhea: NEGATIVE
Trichomonas: NEGATIVE

## 2022-08-07 ENCOUNTER — Other Ambulatory Visit: Payer: Self-pay

## 2022-08-07 ENCOUNTER — Ambulatory Visit
Admission: EM | Admit: 2022-08-07 | Discharge: 2022-08-07 | Disposition: A | Discharge status: Home / Self Care | Payer: PRIVATE HEALTH INSURANCE | Attending: Emergency Medicine | Admitting: Emergency Medicine

## 2022-08-07 ENCOUNTER — Encounter: Payer: Self-pay | Admitting: Emergency Medicine

## 2022-08-07 DIAGNOSIS — N760 Acute vaginitis: Secondary | ICD-10-CM | POA: Diagnosis not present

## 2022-08-07 DIAGNOSIS — Z3202 Encounter for pregnancy test, result negative: Secondary | ICD-10-CM

## 2022-08-07 LAB — POCT URINALYSIS DIP (MANUAL ENTRY)
Bilirubin, UA: NEGATIVE
Blood, UA: NEGATIVE
Glucose, UA: NEGATIVE mg/dL
Ketones, POC UA: NEGATIVE mg/dL
Nitrite, UA: NEGATIVE
Protein Ur, POC: NEGATIVE mg/dL
Spec Grav, UA: 1.025 (ref 1.010–1.025)
Urobilinogen, UA: 1 E.U./dL
pH, UA: 7 (ref 5.0–8.0)

## 2022-08-07 LAB — POCT URINE PREGNANCY: Preg Test, Ur: NEGATIVE

## 2022-08-07 NOTE — ED Provider Notes (Signed)
[facility]    CSN: [id number] Arrival date & time: 08/07/22  1409    HISTORY   Chief Complaint  Patient presents with   Vaginal Discharge   HPI Ruth Fletcher is a pleasant, 32 y.o. female who presents to urgent care today. Patient states she was seen a month ago at this location for symptoms of vaginitis, provided with prescriptions for metronidazole and Diflucan for empiric treatment of presumed BV and vaginal Candida based on symptoms she described to the provider.  Patient did have positive for both.  Patient states she did not finish the metronidazole or the Diflucan, stopped both of them when she was feeling better.  Patient states all of her symptoms have resumed.  The history is provided by the patient.   History reviewed. No pertinent past medical history. There are no problems to display for this patient.  Past Surgical History:  Procedure Laterality Date   TONSILLECTOMY     OB History   No obstetric history on file.    Home Medications    Prior to Admission medications   Medication Sig Start Date End Date Taking? Authorizing Provider  LESSINA-28 0.1-20 MG-MCG tablet Take 1 tablet by mouth daily. 05/10/22   [provider]  spironolactone (ALDACTONE) 100 MG tablet Take 100 mg by mouth daily.    [provider]    Family History Family History  Problem Relation Age of Onset   Diabetes Mother    Social History Social History   Tobacco Use   Smoking status: Every Day    Types: E-cigarettes   Smokeless tobacco: Never  Vaping Use   Vaping Use: Every day   Substances: Nicotine, Flavoring  Substance Use Topics   Alcohol use: Yes    Comment: 2x a week   Drug use: No   Allergies   Patient has no known allergies.  Review of Systems Review of Systems Pertinent findings revealed after performing a 14 point review of systems has been noted in the history of present illness.  Physical Exam Triage Vital Signs ED Triage Vitals   Enc Vitals Group     BP 05/26/21 0827 (!) 147/82     Pulse Rate 05/26/21 0827 72     Resp 05/26/21 0827 18     Temp 05/26/21 0827 98.3 F (36.8 C)     Temp Source 05/26/21 0827 Oral     SpO2 05/26/21 0827 98 %     Weight --      Height --      Head Circumference --      Peak Flow --      Pain Score 05/26/21 0826 5     Pain Loc --      Pain Edu? --      Excl. in GC? --   No data found.  Updated Vital Signs BP 122/77 (BP Location: Right Arm)   Pulse 93   Temp 98.1 F (36.7 C) (Oral)   Resp 18   Ht 5\' 10"  (1.778 m)   Wt 164 lb (74.4 kg)   LMP 07/26/2022 (Approximate)   SpO2 99%   BMI 23.53 kg/m   Physical Exam Vitals and nursing note reviewed.  Constitutional:      General: She is not in acute distress.    Appearance: Normal appearance. She is not ill-appearing.  HENT:     Head: Normocephalic and atraumatic.  Eyes:     General: Lids are normal.  Right eye: No discharge.        Left eye: No discharge.     Extraocular Movements: Extraocular movements intact.     Conjunctiva/sclera: Conjunctivae normal.     Right eye: Right conjunctiva is not injected.     Left eye: Left conjunctiva is not injected.  Neck:     Trachea: Trachea and phonation normal.  Cardiovascular:     Rate and Rhythm: Normal rate and regular rhythm.     Pulses: Normal pulses.     Heart sounds: Normal heart sounds. No murmur heard.    No friction rub. No gallop.  Pulmonary:     Effort: Pulmonary effort is normal. No accessory muscle usage, prolonged expiration or respiratory distress.     Breath sounds: Normal breath sounds. No stridor, decreased air movement or transmitted upper airway sounds. No decreased breath sounds, wheezing, rhonchi or rales.  Chest:     Chest wall: No tenderness.  Genitourinary:    Comments: Patient politely declines pelvic exam today, patient provided a vaginal swab for testing. Musculoskeletal:        General: Normal range of motion.     Cervical back: Normal  range of motion and neck supple. Normal range of motion.  Lymphadenopathy:     Cervical: No cervical adenopathy.  Skin:    General: Skin is warm and dry.     Findings: No erythema or rash.  Neurological:     General: No focal deficit present.     Mental Status: She is alert and oriented to person, place, and time.  Psychiatric:        Mood and Affect: Mood normal.        Behavior: Behavior normal.     Visual Acuity Right Eye Distance:   Left Eye Distance:   Bilateral Distance:    Right Eye Near:   Left Eye Near:    Bilateral Near:     UC Couse / Diagnostics / Procedures:     Radiology No results found.  Procedures Procedures (including critical care time) EKG  Pending results:  Labs Reviewed  POCT URINALYSIS DIP (MANUAL ENTRY) - Abnormal; Notable for the following components:      Result Value   Leukocytes, UA Trace (*)    All other components within normal limits  POCT URINE PREGNANCY  CERVICOVAGINAL ANCILLARY ONLY    Medications Ordered in UC: Medications - No data to display  UC Diagnoses / Final Clinical Impressions(s)   I have reviewed the triage vital signs and the nursing notes.  Pertinent labs & imaging results that were available during my care of the patient were reviewed by me and considered in my medical decision making (see chart for details).    Final diagnoses:  Vaginitis and vulvovaginitis   STD screening was performed, patient advised that the results be posted to their MyChart and if any of the results are positive, they will be notified by phone, further treatment will be provided as indicated based on results of STD screening.  Patient advised that if she tests positive for BV I recommend that she be prescribed metronidazole tablets along with metronidazole gel PV.  Patient advised that if she tests positive for Candida I recommend that she is treated with Diflucan 150 mg every 72 hours x 3 doses.  Patient was advised to abstain from  sexual intercourse until that they receive the results of their STD testing.  Patient was also advised to use condoms to protect themselves from STD exposure.  Urinalysis today was normal. Urine pregnancy test was negative. Return precautions advised.  Drug allergies reviewed, all questions addressed.   Please see discharge instructions below for details of plan of care as provided to patient. ED Prescriptions   None    PDMP not reviewed this encounter.  Disposition Upon Discharge:  Condition: stable for discharge home  Patient presented with concern for an acute illness with associated systemic symptoms and significant discomfort requiring urgent management. In my opinion, this is a condition that a prudent lay person (someone who possesses an average knowledge of health and medicine) may potentially expect to result in complications if not addressed urgently such as respiratory distress, impairment of bodily function or dysfunction of bodily organs.   As such, the patient has been evaluated and assessed, work-up was performed and treatment was provided in alignment with urgent care protocols and evidence based medicine.  Patient/parent/caregiver has been advised that the patient may require follow up for further testing and/or treatment if the symptoms continue in spite of treatment, as clinically indicated and appropriate.  Routine symptom specific, illness specific and/or disease specific instructions were discussed with the patient and/or caregiver at length.  Prevention strategies for avoiding STD exposure were also discussed.  The patient will follow up with their current PCP if and as advised. If the patient does not currently have a PCP we will assist them in obtaining one.   The patient may need specialty follow up if the symptoms continue, in spite of conservative treatment and management, for further workup, evaluation, consultation and treatment as clinically indicated and  appropriate.  Patient/parent/caregiver verbalized understanding and agreement of plan as discussed.  All questions were addressed during visit.  Please see discharge instructions below for further details of plan.  Discharge Instructions:   Discharge Instructions      The results of your vaginal swab test which screens for BV, yeast, gonorrhea, chlamydia and trichomonas will be made posted to your MyChart account once it is complete.  This typically takes 2 to 4 days.  Please abstain from sexual intercourse of any kind, vaginal, oral or anal, until you have received the results of your STD testing.     If any of your results are abnormal, you will receive a phone call regarding treatment.  Prescriptions, if any are needed, will be provided for you at your pharmacy.     Your urine pregnancy test today is negative.   Our point-of-care analysis of your urine sample today was normal and did not reveal any concern for urinary tract infection.   If you have not had complete resolution of your symptoms after completing any recommended treatment or if your symptoms worsen, please return for repeat evaluation.   Thank you for visiting urgent care today.  I appreciate the opportunity to participate in your care.     This office note has been dictated using Teaching laboratory technician.  Unfortunately, this method of dictation can sometimes lead to typographical or grammatical errors.  I apologize for your inconvenience in advance if this occurs.  Please do not hesitate to reach out to me if clarification is needed.       Theadora Rama Scales, PA-C 08/07/22 1531

## 2022-08-07 NOTE — Discharge Instructions (Addendum)
The results of your vaginal swab test which screens for BV, yeast, gonorrhea, chlamydia and trichomonas will be made posted to your MyChart account once it is complete.  This typically takes 2 to 4 days.  Please abstain from sexual intercourse of any kind, vaginal, oral or anal, until you have received the results of your STD testing.     If any of your results are abnormal, you will receive a phone call regarding treatment.  Prescriptions, if any are needed, will be provided for you at your pharmacy.     Your urine pregnancy test today is negative.   Our point-of-care analysis of your urine sample today was normal and did not reveal any concern for urinary tract infection.   If you have not had complete resolution of your symptoms after completing any recommended treatment or if your symptoms worsen, please return for repeat evaluation.   Thank you for visiting urgent care today.  I appreciate the opportunity to participate in your care.  

## 2022-08-07 NOTE — ED Triage Notes (Signed)
Pt c/o increase vaginal discharge for a month requesting to be check for BV.

## 2022-08-08 ENCOUNTER — Telehealth (HOSPITAL_COMMUNITY): Payer: Self-pay | Admitting: Emergency Medicine

## 2022-08-08 LAB — CERVICOVAGINAL ANCILLARY ONLY
Bacterial Vaginitis (gardnerella): POSITIVE — AB
Candida Glabrata: NEGATIVE
Candida Vaginitis: POSITIVE — AB
Chlamydia: NEGATIVE
Comment: NEGATIVE
Comment: NEGATIVE
Comment: NEGATIVE
Comment: NEGATIVE
Comment: NEGATIVE
Comment: NORMAL
Neisseria Gonorrhea: NEGATIVE
Trichomonas: NEGATIVE

## 2022-08-08 MED ORDER — FLUCONAZOLE 150 MG PO TABS: 150.0000 mg | ORAL_TABLET | Freq: Once | ORAL | 0 refills | Status: AC

## 2022-08-08 MED ORDER — METRONIDAZOLE 500 MG PO TABS: 500.0000 mg | ORAL_TABLET | Freq: Two times a day (BID) | ORAL | 0 refills | Status: AC

## 2022-08-08 MED ORDER — METRONIDAZOLE 0.75 % VA GEL: 1.0000 | Freq: Every day | VAGINAL | 0 refills | Status: AC

## 2022-08-29 ENCOUNTER — Ambulatory Visit
Admission: EM | Admit: 2022-08-29 | Discharge: 2022-08-29 | Disposition: A | Discharge status: Home / Self Care | Payer: Commercial Managed Care - PPO | Attending: Nurse Practitioner | Admitting: Nurse Practitioner

## 2022-08-29 DIAGNOSIS — N760 Acute vaginitis: Secondary | ICD-10-CM | POA: Insufficient documentation

## 2022-08-29 MED ORDER — FLUCONAZOLE 150 MG PO TABS: 150.0000 mg | ORAL_TABLET | Freq: Every day | ORAL | 0 refills | Status: AC

## 2022-08-29 MED ORDER — CLINDAMYCIN PHOSPHATE 2 % VA CREA: 1.0000 | TOPICAL_CREAM | Freq: Every day | VAGINAL | 0 refills | Status: AC

## 2022-08-29 NOTE — ED Triage Notes (Signed)
Pt states that she some vaginal itching and vaginal discharge.

## 2022-08-29 NOTE — ED Provider Notes (Signed)
[facility]    CSN: [id number] Arrival date & time: 08/29/22  1441      History   Chief Complaint Chief Complaint  Patient presents with   Vaginal Itching    Vaginal itching and vaginal discharge    HPI Ruth Fletcher is a 32 y.o. female who presents for evaluation of vaginal discharge. Pt reports symptoms began earlier this month.  Patient was seen in urgent care on 1/9 for vaginitis.  Her vaginal swab was positive for both BV and yeast and she was prescribed oral Flagyl, MetroGel, and Diflucan.  She states that she completed these as prescribed but her symptoms did not completely resolve and have now returned.  She endorses a "clumpy" itchy discharge that is the same as her previous symptoms.  Denies dysuria. No STD concern or exposure.  Denies pregnancy concern.  She has had an IUD but had it removed over a year ago.  Pt has no other concerns at this time.    Vaginal Itching    History reviewed. No pertinent past medical history.  There are no problems to display for this patient.   Past Surgical History:  Procedure Laterality Date   TONSILLECTOMY      OB History   No obstetric history on file.      Home Medications    Prior to Admission medications   Medication Sig Start Date End Date Taking? Authorizing Provider  clindamycin (CLEOCIN) 2 % vaginal cream Place 1 Applicatorful vaginally at bedtime for 7 days. 08/29/22 09/05/22 Yes Melynda Ripple, NP  fluconazole (DIFLUCAN) 150 MG tablet Take 1 tablet (150 mg total) by mouth daily. May repeat in 3 days if symptoms persist 08/29/22  Yes Melynda Ripple, NP  LESSINA-28 0.1-20 MG-MCG tablet Take 1 tablet by mouth daily. 05/10/22  Yes [provider]  spironolactone (ALDACTONE) 100 MG tablet Take 100 mg by mouth daily.   Yes [provider]  metroNIDAZOLE (FLAGYL) 500 MG tablet Take 1 tablet (500 mg total) by mouth 2 (two) times daily. 08/08/22   Lamptey, Myrene Galas, MD    Family History Family  History  Problem Relation Age of Onset   Diabetes Mother     Social History Social History   Tobacco Use   Smoking status: Every Day    Types: E-cigarettes   Smokeless tobacco: Never  Vaping Use   Vaping Use: Every day   Substances: Nicotine, Flavoring  Substance Use Topics   Alcohol use: Yes    Comment: 2x a week   Drug use: No     Allergies   Patient has no known allergies.   Review of Systems Review of Systems  Genitourinary:  Positive for vaginal discharge.     Physical Exam Triage Vital Signs ED Triage Vitals  Enc Vitals Group     BP 08/29/22 1503 115/69     Pulse Rate 08/29/22 1503 82     Resp 08/29/22 1503 20     Temp 08/29/22 1503 97.8 F (36.6 C)     Temp Source 08/29/22 1503 Oral     SpO2 08/29/22 1503 95 %     Weight 08/29/22 1500 168 lb (76.2 kg)     Height 08/29/22 1500 5\' 10"  (1.778 m)     Head Circumference --      Peak Flow --      Pain Score 08/29/22 1459 0     Pain Loc --      Pain Edu? --  Excl. in GC? --    No data found.  Updated Vital Signs BP 115/69 (BP Location: Left Arm)   Pulse 82   Temp 97.8 F (36.6 C) (Oral)   Resp 20   Ht 5\' 10"  (1.778 m)   Wt 168 lb (76.2 kg)   LMP 08/16/2022 (Approximate)   SpO2 95%   BMI 24.11 kg/m   Visual Acuity Right Eye Distance:   Left Eye Distance:   Bilateral Distance:    Right Eye Near:   Left Eye Near:    Bilateral Near:     Physical Exam Vitals and nursing note reviewed.  Constitutional:      General: She is not in acute distress.    Appearance: Normal appearance. She is not ill-appearing.  HENT:     Head: Normocephalic and atraumatic.  Eyes:     Pupils: Pupils are equal, round, and reactive to light.  Cardiovascular:     Rate and Rhythm: Normal rate.  Pulmonary:     Effort: Pulmonary effort is normal.  Skin:    General: Skin is warm and dry.  Neurological:     General: No focal deficit present.     Mental Status: She is alert and oriented to person, place, and  time.  Psychiatric:        Mood and Affect: Mood normal.        Behavior: Behavior normal.      UC Treatments / Results  Labs (all labs ordered are listed, but only abnormal results are displayed) Labs Reviewed  CERVICOVAGINAL ANCILLARY ONLY    EKG   Radiology No results found.  Procedures Procedures (including critical care time)  Medications Ordered in UC Medications - No data to display  Initial Impression / Assessment and Plan / UC Course  I have reviewed the triage vital signs and the nursing notes.  Pertinent labs & imaging results that were available during my care of the patient were reviewed by me and considered in my medical decision making (see chart for details).     Reviewed exam and symptoms with patient. Will have repeat vaginal swab and contact if results are positive Given her persistent symptoms we will treat with Cleocin vaginal cream for 7 days and repeat 2 doses of Diflucan Advise she will need GYN follow-up if her symptoms persist ER precautions reviewed and patient verbalized understanding Final Clinical Impressions(s) / UC Diagnoses   Final diagnoses:  Acute vaginitis     Discharge Instructions      Start clindamycin vaginal inserts nightly for 7 days Diflucan x 1 and then may repeat in 3 days if your symptoms persist We will contact you with results of the testing done today Please follow-up with your gynecologist or PCP if symptoms or not improving Please go to emergency room for any worsening symptoms   ED Prescriptions     Medication Sig Dispense Auth. Provider   clindamycin (CLEOCIN) 2 % vaginal cream Place 1 Applicatorful vaginally at bedtime for 7 days. 40 g Melynda Ripple, NP   fluconazole (DIFLUCAN) 150 MG tablet Take 1 tablet (150 mg total) by mouth daily. May repeat in 3 days if symptoms persist 2 tablet Melynda Ripple, NP      PDMP not reviewed this encounter.   Melynda Ripple, NP 08/29/22 [phone]

## 2022-08-29 NOTE — Discharge Instructions (Signed)
Start clindamycin vaginal inserts nightly for 7 days Diflucan x 1 and then may repeat in 3 days if your symptoms persist We will contact you with results of the testing done today Please follow-up with your gynecologist or PCP if symptoms or not improving Please go to emergency room for any worsening symptoms

## 2022-09-05 LAB — CERVICOVAGINAL ANCILLARY ONLY
Bacterial Vaginitis (gardnerella): NEGATIVE
Candida Glabrata: NEGATIVE
Candida Vaginitis: POSITIVE — AB
Comment: NEGATIVE
Comment: NEGATIVE
Comment: NEGATIVE
